# Patient Record
Sex: Female | Born: 1970 | Marital: Married | Smoking: Never smoker
Health system: Southern US, Community
[De-identification: ages and names within clinical notes are randomized; demographics above are authoritative.]

---

## 2009-08-13 ENCOUNTER — Encounter: Payer: Self-pay | Admitting: Infectious Diseases

## 2009-08-13 DIAGNOSIS — A310 Pulmonary mycobacterial infection: Secondary | ICD-10-CM | POA: Insufficient documentation

## 2009-08-19 ENCOUNTER — Encounter: Payer: Self-pay | Admitting: Infectious Disease

## 2009-09-10 ENCOUNTER — Ambulatory Visit (HOSPITAL_COMMUNITY): Admission: RE | Admit: 2009-09-10 | Discharge: 2009-09-10 | Payer: Self-pay | Admitting: Infectious Disease

## 2009-09-10 ENCOUNTER — Ambulatory Visit: Payer: Self-pay | Admitting: Infectious Disease

## 2009-09-10 DIAGNOSIS — R930 Abnormal findings on diagnostic imaging of skull and head, not elsewhere classified: Secondary | ICD-10-CM

## 2009-09-10 DIAGNOSIS — A150 Tuberculosis of lung: Secondary | ICD-10-CM | POA: Insufficient documentation

## 2009-09-10 LAB — CONVERTED CEMR LAB
Albumin: 4.5 g/dL (ref 3.5–5.2)
Basophils Relative: 0 % (ref 0–1)
CO2: 25 meq/L (ref 19–32)
Calcium: 9.2 mg/dL (ref 8.4–10.5)
Chloride: 103 meq/L (ref 96–112)
Glucose, Bld: 99 mg/dL (ref 70–99)
Hemoglobin: 13.7 g/dL (ref 12.0–15.0)
Lymphocytes Relative: 41 % (ref 12–46)
MCHC: 32.4 g/dL (ref 30.0–36.0)
Monocytes Absolute: 0.4 10*3/uL (ref 0.1–1.0)
Monocytes Relative: 6 % (ref 3–12)
Neutro Abs: 3.8 10*3/uL (ref 1.7–7.7)
Potassium: 4.4 meq/L (ref 3.5–5.3)
RBC: 4.79 M/uL (ref 3.87–5.11)
Sodium: 140 meq/L (ref 135–145)
Total Protein: 7.4 g/dL (ref 6.0–8.3)

## 2020-10-06 ENCOUNTER — Other Ambulatory Visit: Payer: Self-pay

## 2020-10-06 DIAGNOSIS — Z1231 Encounter for screening mammogram for malignant neoplasm of breast: Secondary | ICD-10-CM

## 2020-10-19 ENCOUNTER — Other Ambulatory Visit: Payer: Self-pay

## 2020-10-19 ENCOUNTER — Ambulatory Visit
Admission: RE | Admit: 2020-10-19 | Discharge: 2020-10-19 | Disposition: A | Discharge status: Home / Self Care | Payer: No Typology Code available for payment source | Source: Ambulatory Visit | Attending: Nurse Practitioner | Admitting: Nurse Practitioner

## 2020-10-23 ENCOUNTER — Other Ambulatory Visit: Payer: Self-pay | Admitting: Obstetrics and Gynecology

## 2020-10-23 ENCOUNTER — Other Ambulatory Visit: Payer: Self-pay

## 2020-10-23 DIAGNOSIS — R928 Other abnormal and inconclusive findings on diagnostic imaging of breast: Secondary | ICD-10-CM

## 2020-11-17 ENCOUNTER — Other Ambulatory Visit: Payer: Self-pay

## 2020-11-17 ENCOUNTER — Ambulatory Visit: Payer: Self-pay | Admitting: *Deleted

## 2020-11-17 ENCOUNTER — Ambulatory Visit
Admission: RE | Admit: 2020-11-17 | Discharge: 2020-11-17 | Disposition: A | Discharge status: Home / Self Care | Payer: No Typology Code available for payment source | Source: Ambulatory Visit | Attending: Obstetrics and Gynecology | Admitting: Obstetrics and Gynecology

## 2020-11-17 VITALS — BP 110/68 | Wt 136.0 lb

## 2020-11-17 DIAGNOSIS — R928 Other abnormal and inconclusive findings on diagnostic imaging of breast: Secondary | ICD-10-CM

## 2020-11-17 DIAGNOSIS — Z01419 Encounter for gynecological examination (general) (routine) without abnormal findings: Secondary | ICD-10-CM

## 2020-11-17 NOTE — Patient Instructions (Addendum)
Explained breast self awareness with Elizabeth Zavala. Pap smear completed today. Let her know BCCCP will cover Pap smears and HPV typing every 5 years unless has a history of abnormal Pap smears. Referred patient to the [facility] for a left breast diagnostic mammogram per recommendation. Appointment scheduled Tuesday, November 17, 2020 at 1450. Patient aware of appointment and will be there. Let patient know will follow up with her within the next couple week with results of Wet prep and Pap smear by phone. Elizabeth Zavala verbalized understanding.  Elizabeth Zavala, Elizabeth Maser, RN 1:41 PM

## 2020-11-17 NOTE — Progress Notes (Signed)
[name] is a 49 y.o. No obstetric history on file. female who presents to [facility] clinic today with complaint of left outer breast pain x one month that comes and goes. Patient rates the pain at a 2-3 out of 10. Patient had a screening mammogram completed 10/19/2020 that additional imaging of the left breast is recommended for follow-up.    Pap Smear: Pap smear completed today. Last Pap smear was in July 2018 at [facility] clinic and was normal per patient. Per patient has no history of an abnormal Pap smear. Last Pap smear result is not available in Epic.   Physical exam: Breasts Breasts symmetrical. No skin abnormalities bilateral breasts. No nipple retraction bilateral breasts. No nipple discharge bilateral breasts. No lymphadenopathy. No lumps palpated bilateral breasts. No complaints of pain or tenderness on exam.      Pelvic/Bimanual Ext Genitalia No lesions, no swelling and no discharge observed on external genitalia.        Vagina Vagina pink and normal texture. No lesions and yellowish colored discharge observed in vagina. Wet prep completed.        Cervix Cervix is present. Cervix pink and of normal texture. No discharge observed.    Uterus Uterus is present and palpable. Uterus in normal position and normal size.        Adnexae Bilateral ovaries present and palpable. No tenderness on palpation.         Rectovaginal No rectal exam completed today since patient had no rectal complaints. No skin abnormalities observed on exam.     Smoking History: Patient has never smoked.   Patient Navigation: Patient education provided. Access to services provided for patient through [place] program. Spanish interpreter Natale Lay from [facility] provided.    Breast and Cervical Cancer Risk Assessment: Patient does not have family history of breast cancer, known genetic mutations, or radiation treatment to the chest before age 3. Patient does not have history  of cervical dysplasia, immunocompromised, or DES exposure in-utero.  Risk Assessment    Risk Scores      11/17/2020   Last edited by: Narda Rutherford, LPN   5-year risk: 0.7 %   Lifetime risk: 7.1 %          A: BCCCP exam with pap smear Complaint of left breast pain.  P: Referred patient to the [facility] for a left breast diagnostic mammogram per recommendation. Appointment scheduled Tuesday, November 17, 2020 at 1450.  Priscille Heidelberg, RN 11/17/2020 1:41 PM

## 2020-11-18 ENCOUNTER — Other Ambulatory Visit: Payer: Self-pay

## 2020-11-18 ENCOUNTER — Telehealth: Payer: Self-pay

## 2020-11-18 LAB — CERVICOVAGINAL ANCILLARY ONLY
Bacterial Vaginitis (gardnerella): POSITIVE — AB
Candida Glabrata: NEGATIVE
Candida Vaginitis: NEGATIVE
Comment: NEGATIVE
Comment: NEGATIVE
Comment: NEGATIVE
Comment: NEGATIVE
Trichomonas: NEGATIVE

## 2020-11-18 MED ORDER — METRONIDAZOLE 500 MG PO TABS: 500.0000 mg | ORAL_TABLET | Freq: Two times a day (BID) | ORAL | 0 refills | Status: DC

## 2020-11-18 NOTE — Telephone Encounter (Signed)
Via Delorise Royals, Spanish Interpreter, Patient informed wet prep results-Bacterial Vaginitis, needs rx Metronidazole sent to pharmacy. Patient verbalized understanding. Rx sent to [place], [place].

## 2020-11-24 LAB — CYTOLOGY - PAP
Comment: NEGATIVE
Diagnosis: NEGATIVE
High risk HPV: NEGATIVE

## 2020-12-17 ENCOUNTER — Other Ambulatory Visit: Payer: No Typology Code available for payment source

## 2021-09-26 IMAGING — US US BREAST*L* LIMITED INC AXILLA
1 series · 6 of 6 positions shown · non-contrast
Comparison: Previous exam(s).

CLINICAL DATA: 48-year-old female presenting as a recall from
screening for possible left breast mass.

EXAM:
DIGITAL DIAGNOSTIC LEFT MAMMOGRAM WITH TOMO
ULTRASOUND LEFT BREAST

[Series 1: us breast*left* limited inc axilla · 0.06mm/px · 6 of 6 slices shown]
[im 1/6]
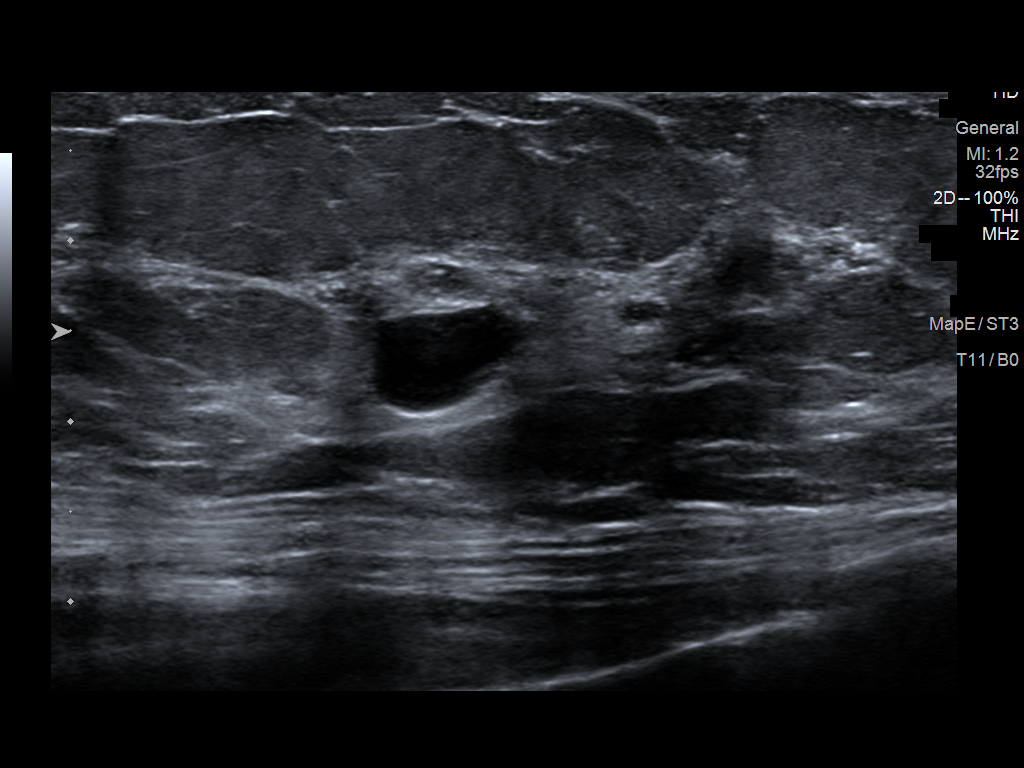
[im 2/6]
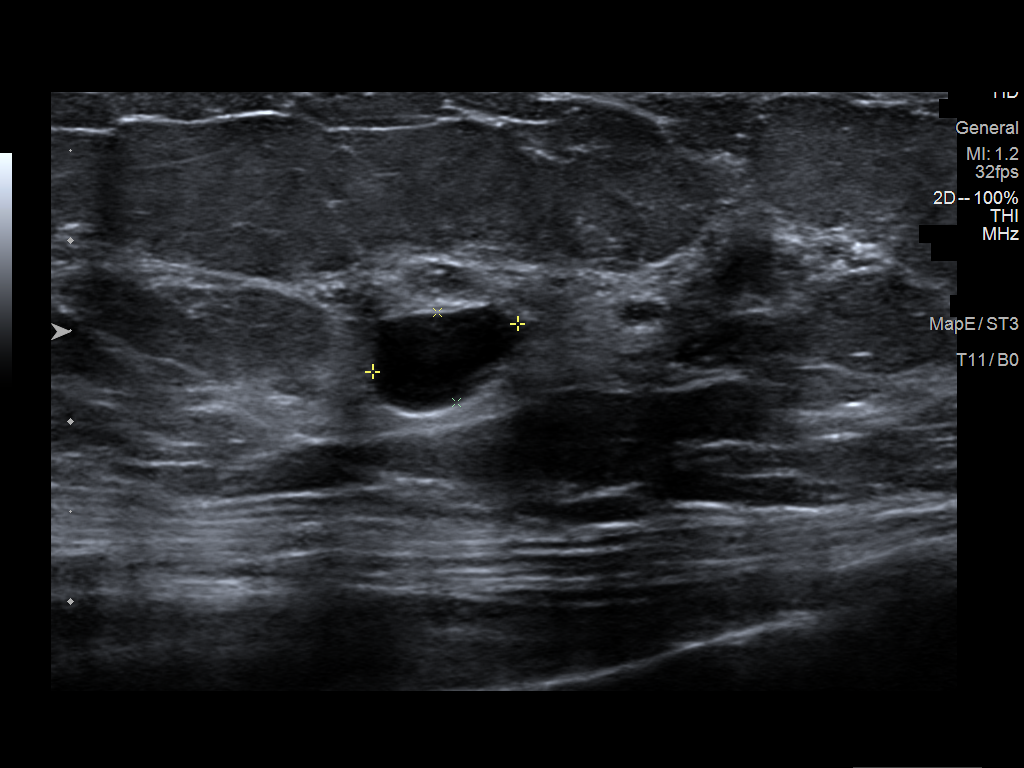
[im 3/6]
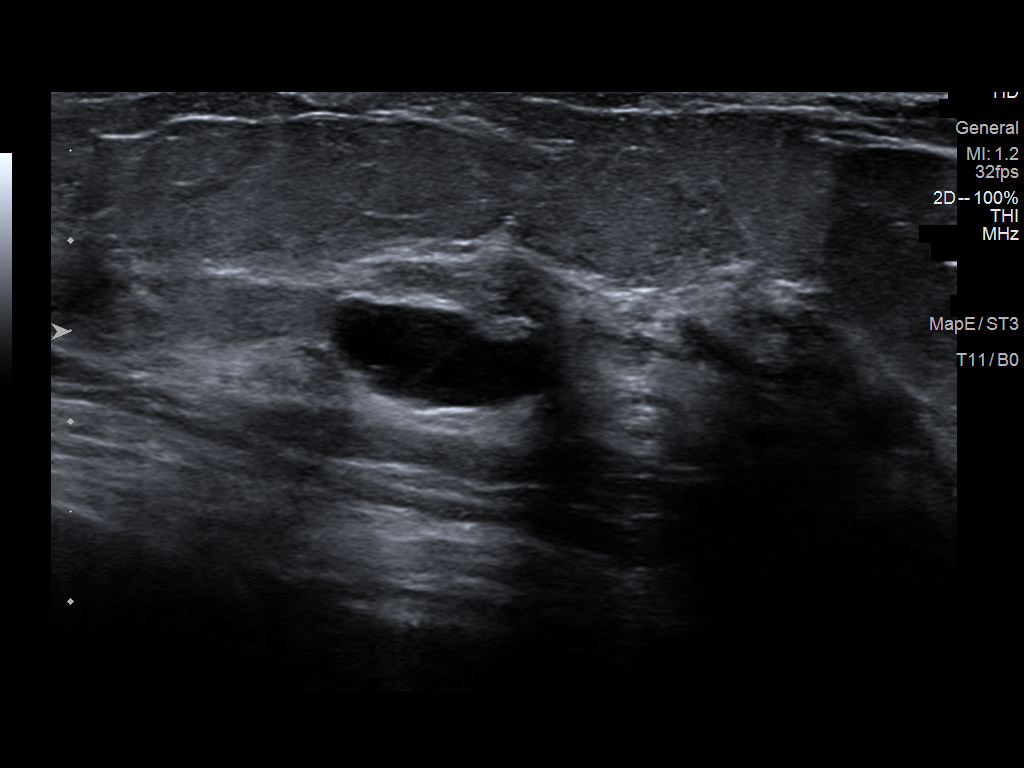
[im 4/6]
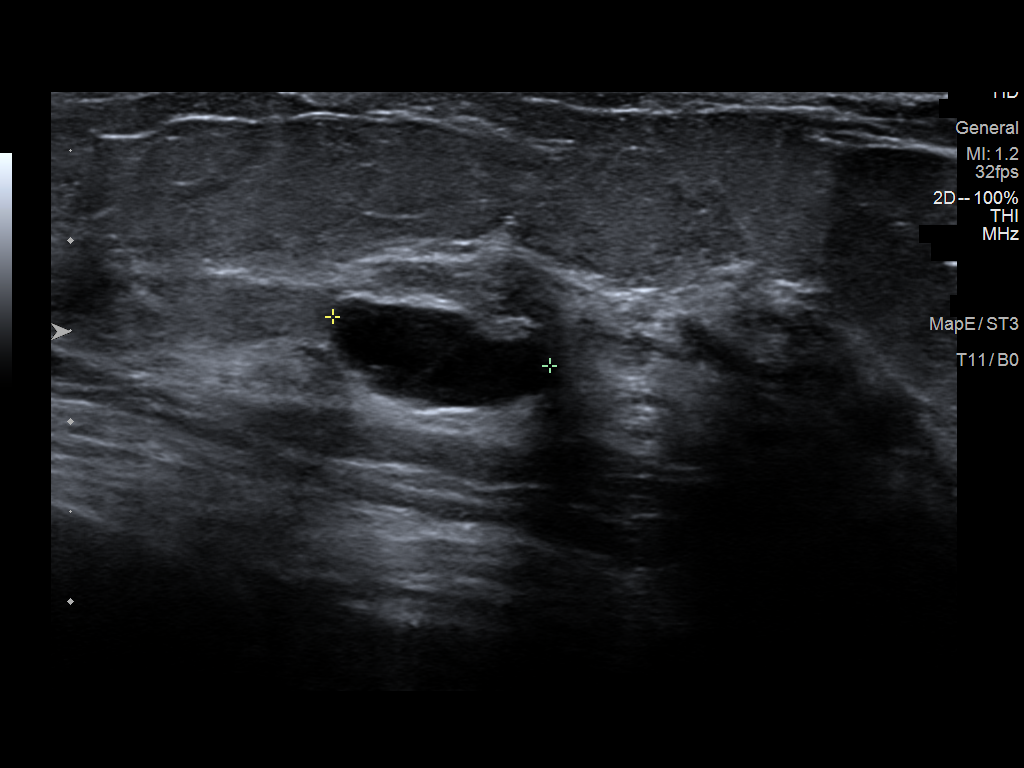
[im 5/6]
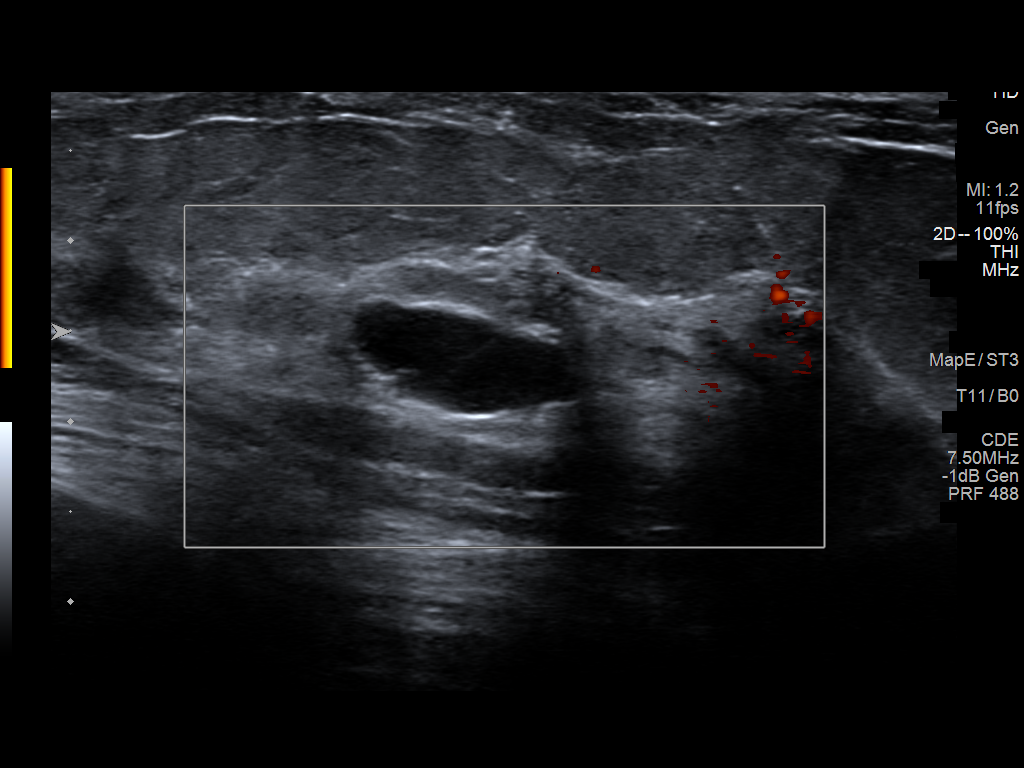
[im 6/6]
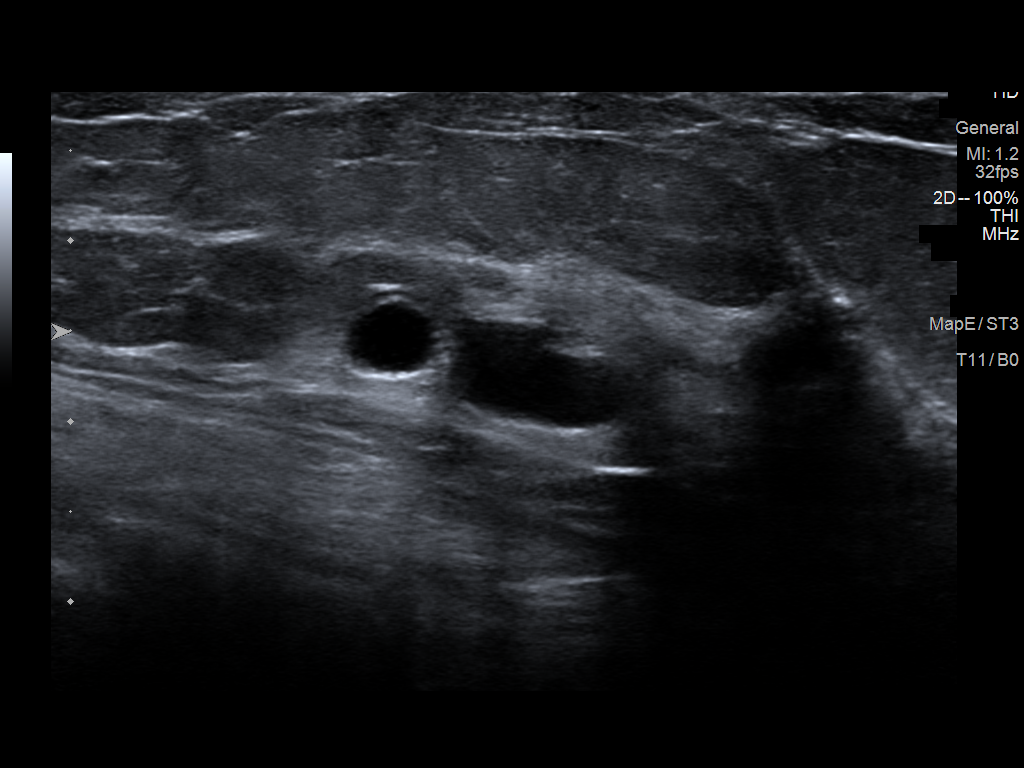

[6 of 6 positions shown; findings below may reference images not displayed]

ACR Breast Density Category c: The breast tissue is heterogeneously
dense, which may obscure small masses.
FINDINGS: Mammogram:

Spot compression tomosynthesis views of the left breast performed
demonstrating persistence of an oval circumscribed mass in the
medial left breast measuring approximately 0.6 cm.

Ultrasound:

Targeted ultrasound is performed in the medial aspect of the left
breast demonstrating several simple cysts. The largest of these at 9
o'clock 2 cm from the nipple measures 0.9 x 0.5 x 1.2 cm. An
adjacent simple cyst measuring approximately 0.5 cm may correspond
to the mass seen mammographically. There is no suspicious solid
mass.
IMPRESSION: Benign simple cysts in the medial left breast.

RECOMMENDATION:
Screening mammogram in one year.(Code:GV-[id number])

I have discussed the findings and recommendations with the patient.
If applicable, a reminder letter will be sent to the patient
regarding the next appointment.

BI-RADS CATEGORY  2: Benign.

## 2022-07-14 ENCOUNTER — Other Ambulatory Visit: Payer: Self-pay | Admitting: Obstetrics and Gynecology

## 2022-07-14 DIAGNOSIS — Z1231 Encounter for screening mammogram for malignant neoplasm of breast: Secondary | ICD-10-CM

## 2022-09-01 ENCOUNTER — Ambulatory Visit
Admission: RE | Admit: 2022-09-01 | Discharge: 2022-09-01 | Disposition: A | Discharge status: Home / Self Care | Payer: No Typology Code available for payment source | Source: Ambulatory Visit | Attending: Obstetrics and Gynecology | Admitting: Obstetrics and Gynecology

## 2022-09-01 ENCOUNTER — Ambulatory Visit: Payer: Self-pay | Admitting: *Deleted

## 2022-09-01 VITALS — BP 123/81 | Wt 143.9 lb

## 2022-09-01 DIAGNOSIS — Z1239 Encounter for other screening for malignant neoplasm of breast: Secondary | ICD-10-CM

## 2022-09-01 DIAGNOSIS — Z1231 Encounter for screening mammogram for malignant neoplasm of breast: Secondary | ICD-10-CM

## 2022-09-01 DIAGNOSIS — Z1211 Encounter for screening for malignant neoplasm of colon: Secondary | ICD-10-CM

## 2022-09-01 NOTE — Progress Notes (Signed)
Elizabeth Zavala is a 51 y.o. female who presents to [facility] clinic today with no complaints.    Pap Smear: Pap smear not completed today. Last Pap smear was 11/17/2020 at the free cervical cancer screening clinic and was normal with negative HPV. Per patient has no history of an abnormal Pap smear. Last Pap smear result is not available in Epic.   Physical exam: Breasts Breasts symmetrical. No skin abnormalities bilateral breasts. No nipple retraction bilateral breasts. No nipple discharge bilateral breasts. No lymphadenopathy. No lumps palpated bilateral breasts. No complaints of pain or tenderness on exam.     MS DIGITAL DIAG TOMO UNI LEFT  Result Date: 11/17/2020 CLINICAL DATA:  51 year old female presenting as a recall from screening for possible left breast mass. EXAM: DIGITAL DIAGNOSTIC LEFT MAMMOGRAM WITH TOMO ULTRASOUND LEFT BREAST COMPARISON:  Previous exam(s). ACR Breast Density Category c: The breast tissue is heterogeneously dense, which may obscure small masses. FINDINGS: Mammogram: Spot compression tomosynthesis views of the left breast performed demonstrating persistence of an oval circumscribed mass in the medial left breast measuring approximately 0.6 cm. Ultrasound: Targeted ultrasound is performed in the medial aspect of the left breast demonstrating several simple cysts. The largest of these at 9 o'clock 2 cm from the nipple measures 0.9 x 0.5 x 1.2 cm. An adjacent simple cyst measuring approximately 0.5 cm may correspond to the mass seen mammographically. There is no suspicious solid mass. IMPRESSION: Benign simple cysts in the medial left breast. RECOMMENDATION: Screening mammogram in one year.(Code:[id number]) I have discussed the findings and recommendations with the patient. If applicable, a reminder letter will be sent to the patient regarding the next appointment. BI-RADS CATEGORY  2: Benign. Electronically Signed   By: Emmaline Kluver M.D.   On: 11/17/2020 15:53   MS DIGITAL  SCREENING TOMO BILATERAL  Result Date: 10/22/2020 CLINICAL DATA:  Screening. EXAM: DIGITAL SCREENING BILATERAL MAMMOGRAM WITH TOMO AND CAD COMPARISON:  Previous exam(s). ACR Breast Density Category c: The breast tissue is heterogeneously dense, which may obscure small masses. FINDINGS: In the left breast, a possible mass warrants further evaluation. In the right breast, no findings suspicious for malignancy. Images were processed with CAD. IMPRESSION: Further evaluation is suggested for a possible mass in the left breast. RECOMMENDATION: Diagnostic mammogram and possibly ultrasound of the left breast. (Code:[id number]) The patient will be contacted regarding the findings, and additional imaging will be scheduled. BI-RADS CATEGORY  0: Incomplete. Need additional imaging evaluation and/or prior mammograms for comparison. Electronically Signed   By: Emmaline Kluver M.D.   On: 10/22/2020 12:13     Pelvic/Bimanual Pap is not indicated today per BCCCP guidelines.   Smoking History: Patient has never smoked.   Patient Navigation: Patient education provided. Access to services provided for patient through [place] program. Spanish interpreter Natale Lay from [facility] provided.   Colorectal Cancer Screening: Per patient has never had colonoscopy completed. FIT Test given to patient to complete. No complaints today.    Breast and Cervical Cancer Risk Assessment: Patient does not have family history of breast cancer, known genetic mutations, or radiation treatment to the chest before age 71. Patient does not have history of cervical dysplasia, immunocompromised, or DES exposure in-utero.  Risk Assessment     Risk Scores       09/01/2022 11/17/2020   Last edited by: Narda Rutherford, LPN McGill, Sherie Demetrius Charity, LPN   5-year risk: 0.8 % 0.7 %   Lifetime risk: 6.9 % 7.1 %  A: BCCCP exam without pap smear No complaints.  P: Referred patient to the [facility] for a screening  mammogram on mobile unit. Appointment scheduled Thursday, September 01, 2022 at 1430.  Priscille Heidelberg, RN 09/01/2022 2:08 PM

## 2022-09-01 NOTE — Patient Instructions (Signed)
Explained breast self awareness with Elizabeth Zavala. Patient did not need a Pap smear today due to last Pap smear and HPV typing was 11/17/2020. Let her know BCCCP will cover Pap smears and HPV typing every 5 years unless has a history of abnormal Pap smears. Referred patient to the [facility] for a screening mammogram on mobile unit. Appointment scheduled Thursday, September 01, 2022 at 1430. Patient aware of appointment and will be there. Let patient know the Breast Center will follow up with her within the next couple weeks with results of her mammogram by letter or phone. Elizabeth Zavala verbalized understanding.  Elizabeth Zavala, Elizabeth Maser, RN 2:08 PM

## 2024-01-16 ENCOUNTER — Telehealth: Payer: Self-pay

## 2024-01-16 NOTE — Telephone Encounter (Signed)
Telephoned patient using interpreter, Elizabeth Zavala. No answer, B[facility]CP (scholarship) unable to leave a message with BCCCP (scholarship) contact information.

## 2024-01-30 ENCOUNTER — Other Ambulatory Visit: Payer: Self-pay | Admitting: Family Medicine

## 2024-01-30 DIAGNOSIS — Z1231 Encounter for screening mammogram for malignant neoplasm of breast: Secondary | ICD-10-CM

## 2024-02-19 ENCOUNTER — Other Ambulatory Visit: Payer: Self-pay | Admitting: Family Medicine

## 2024-02-19 ENCOUNTER — Inpatient Hospital Stay: Admission: RE | Admit: 2024-02-19 | Payer: No Typology Code available for payment source | Source: Ambulatory Visit

## 2024-02-19 DIAGNOSIS — N644 Mastodynia: Secondary | ICD-10-CM

## 2024-02-28 ENCOUNTER — Inpatient Hospital Stay: Payer: No Typology Code available for payment source | Attending: Hematology and Oncology | Admitting: *Deleted

## 2024-02-28 VITALS — BP 122/71 | Wt 138.0 lb

## 2024-02-28 DIAGNOSIS — N644 Mastodynia: Secondary | ICD-10-CM

## 2024-02-28 DIAGNOSIS — Z1239 Encounter for other screening for malignant neoplasm of breast: Secondary | ICD-10-CM

## 2024-02-28 DIAGNOSIS — N6322 Unspecified lump in the left breast, upper inner quadrant: Secondary | ICD-10-CM

## 2024-02-28 NOTE — Patient Instructions (Signed)
 Explained breast self awareness with [name] Section. Patient did not need a Pap smear today due to last Pap smear and HPV typing was 11/17/2020. Let her know BCCCP will cover Pap smears and HPV typing every 5 years unless has a history of abnormal Pap smears. Referred patient to [facility] for a diagnostic mammogram. Appointment scheduled Wednesday, April 03, 2024 at 1410. Patient aware of appointment and will be there. Elizabeth Zavala verbalized understanding.  Shealeigh Dunstan, Kathaleen Maser, RN 10:25 AM

## 2024-02-28 NOTE — Progress Notes (Signed)
 [name] is a 53 y.o. female who presents to [facility] clinic today with complaint of left outer breast pain x 6 months that comes and goes. Patient states pain is sharp at times. Patient rates pain at a 6-7 out of 10.    Pap Smear: Pap smear not completed today. Last Pap smear was 11/17/2020 at the free cervical cancer screening clinic and was normal with negative HPV. Per patient has no history of an abnormal Pap smear. Last Pap smear result is not available in Epic.    Physical exam: Breasts Breasts symmetrical. No skin abnormalities bilateral breasts. No nipple retraction bilateral breasts. No nipple discharge bilateral breasts. No lymphadenopathy. No lumps palpated right breast. Palpated a bean sized lump within the left breast at 11 o'clock 1 cm from the nipple. Complaints of left outer breast pain on exam.  MS DIGITAL SCREENING TOMO BILATERAL Result Date: 09/05/2022 CLINICAL DATA:  Screening. EXAM: DIGITAL SCREENING BILATERAL MAMMOGRAM WITH TOMOSYNTHESIS AND CAD TECHNIQUE: Bilateral screening digital craniocaudal and mediolateral oblique mammograms were obtained. Bilateral screening digital breast tomosynthesis was performed. The images were evaluated with computer-aided detection. COMPARISON:  Previous exam(s). ACR Breast Density Category c: The breast tissue is heterogeneously dense, which may obscure small masses. FINDINGS: There are no findings suspicious for malignancy. IMPRESSION: No mammographic evidence of malignancy. A result letter of this screening mammogram will be mailed directly to the patient. RECOMMENDATION: Screening mammogram in one year. (Code:[id number]) BI-RADS CATEGORY  1: Negative. Electronically Signed   By: Elizabeth Zavala M.D.   On: 09/05/2022 10:14   MS DIGITAL DIAG TOMO UNI LEFT Result Date: 11/17/2020 CLINICAL DATA:  53 year old female presenting as a recall from screening for possible left breast mass. EXAM: DIGITAL DIAGNOSTIC LEFT MAMMOGRAM WITH TOMO ULTRASOUND  LEFT BREAST COMPARISON:  Previous exam(s). ACR Breast Density Category c: The breast tissue is heterogeneously dense, which may obscure small masses. FINDINGS: Mammogram: Spot compression tomosynthesis views of the left breast performed demonstrating persistence of an oval circumscribed mass in the medial left breast measuring approximately 0.6 cm. Ultrasound: Targeted ultrasound is performed in the medial aspect of the left breast demonstrating several simple cysts. The largest of these at 9 o'clock 2 cm from the nipple measures 0.9 x 0.5 x 1.2 cm. An adjacent simple cyst measuring approximately 0.5 cm may correspond to the mass seen mammographically. There is no suspicious solid mass. IMPRESSION: Benign simple cysts in the medial left breast. RECOMMENDATION: Screening mammogram in one year.(Code:[id number]) I have discussed the findings and recommendations with the patient. If applicable, a reminder letter will be sent to the patient regarding the next appointment. BI-RADS CATEGORY  2: Benign. Electronically Signed   By: Elizabeth Zavala M.D.   On: 11/17/2020 15:53   MS DIGITAL SCREENING TOMO BILATERAL Result Date: 10/22/2020 CLINICAL DATA:  Screening. EXAM: DIGITAL SCREENING BILATERAL MAMMOGRAM WITH TOMO AND CAD COMPARISON:  Previous exam(s). ACR Breast Density Category c: The breast tissue is heterogeneously dense, which may obscure small masses. FINDINGS: In the left breast, a possible mass warrants further evaluation. In the right breast, no findings suspicious for malignancy. Images were processed with CAD. IMPRESSION: Further evaluation is suggested for a possible mass in the left breast. RECOMMENDATION: Diagnostic mammogram and possibly ultrasound of the left breast. (Code:[id number]) The patient will be contacted regarding the findings, and additional imaging will be scheduled. BI-RADS CATEGORY  0: Incomplete. Need additional imaging evaluation and/or prior mammograms for comparison. Electronically Signed    By: Adline Potter.D.  On: 10/22/2020 12:13    Pelvic/Bimanual Pap is not indicated today per BCCCP guidelines.   Smoking History: Patient has never smoked.   Patient Navigation: Patient education provided. Access to services provided for patient through [facility] program. Spanish interpreter Elizabeth Zavala from [facility] provided.   Colorectal Cancer Screening: Per patient has never had colonoscopy completed. Patient stated she completed a Cologuard test given by PCP at [facility] 12/28/2023 that was negative. No complaints today.    Breast and Cervical Cancer Risk Assessment: Patient does not have family history of breast cancer, known genetic mutations, or radiation treatment to the chest before age 70. Patient does not have history of cervical dysplasia, immunocompromised, or DES exposure in-utero.  Risk Scores as of Encounter on 02/28/2024     Elizabeth Zavala           5-year 1.22%   Lifetime 9.42%   This patient is [place] but has no documented birth country, so the [place] model used data from [place] patients to calculate their risk score. Document a birth country in the Demographics activity for a more accurate score.         Last calculated by Elizabeth Zavala, CMA on 02/28/2024 at 10:26 AM        A: BCCCP exam without pap smear Complaint of left breast pain.  P: Referred patient to [facility] for a diagnostic mammogram. Appointment scheduled Wednesday, April 03, 2024 at 1410.  Elizabeth Heidelberg, RN 02/28/2024 10:25 AM
